# Patient Record
Sex: Male | Born: 2000 | Race: Black or African American | Hispanic: No | Marital: Single
Health system: Southern US, Community
[De-identification: ages and names within clinical notes are randomized; demographics above are authoritative.]

---
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
## 2019-01-09 ENCOUNTER — Other Ambulatory Visit: Payer: Self-pay

## 2019-01-09 DIAGNOSIS — Z20822 Contact with and (suspected) exposure to covid-19: Secondary | ICD-10-CM

## 2019-01-11 LAB — NOVEL CORONAVIRUS, NAA: SARS-CoV-2, NAA: NOT DETECTED

## 2019-09-10 ENCOUNTER — Other Ambulatory Visit: Payer: Self-pay

## 2019-09-10 ENCOUNTER — Encounter: Payer: Self-pay | Admitting: Family Medicine

## 2019-09-10 ENCOUNTER — Ambulatory Visit: Payer: Self-pay

## 2019-09-10 ENCOUNTER — Ambulatory Visit (INDEPENDENT_AMBULATORY_CARE_PROVIDER_SITE_OTHER): Payer: Commercial Managed Care - PPO | Admitting: Family Medicine

## 2019-09-10 VITALS — BP 106/74 | HR 71 | Ht 69.0 in | Wt 173.0 lb

## 2019-09-10 DIAGNOSIS — M7650 Patellar tendinitis, unspecified knee: Secondary | ICD-10-CM | POA: Insufficient documentation

## 2019-09-10 DIAGNOSIS — M7652 Patellar tendinitis, left knee: Secondary | ICD-10-CM | POA: Diagnosis not present

## 2019-09-10 DIAGNOSIS — G8929 Other chronic pain: Secondary | ICD-10-CM | POA: Diagnosis not present

## 2019-09-10 DIAGNOSIS — M25562 Pain in left knee: Secondary | ICD-10-CM

## 2019-09-10 MED ORDER — VITAMIN D (ERGOCALCIFEROL) 1.25 MG (50000 UNIT) PO CAPS: 50000.0000 [IU] | ORAL_CAPSULE | ORAL | 0 refills | Status: AC

## 2019-09-10 MED ORDER — NITROGLYCERIN 0.1 MG/HR TD PT24: MEDICATED_PATCH | TRANSDERMAL | 12 refills | Status: AC

## 2019-09-10 NOTE — Progress Notes (Signed)
[name] [address] [place] [address] Phone: [phone] Subjective:   Bruce Donath, am serving as a scribe for Dr. Antoine Primas. This visit occurred during the SARS-CoV-2 public health emergency.  Safety protocols were in place, including screening questions prior to the visit, additional usage of staff PPE, and extensive cleaning of exam room while observing appropriate contact time as indicated for disinfecting solutions.   I'm seeing this patient by the request  of:  Diamantina Monks, MD  CC: Left knee pain  [id number]  Timothy Huff is a 19 y.o. male coming in with complaint of left knee pain for over one year. No MOI. Played basketball through season despite pain. Jumping and sprinting bother him. Pain over patellar tendon. Pain has not improved.  Patient has had this for quite some time.  Wants to make sure that it is being treated appropriately.  Patient denies any instability of the knee.  Is able to play through it but unfortunately does have significant pain sometimes that stops him from activity minorly.  States that he can wait minutes and then I will feel little better again.       Past Medical History:  Diagnosis Date  . Asthma    No past surgical history on file. Social History   Socioeconomic History  . Marital status: Single    Spouse name: Not on file  . Number of children: Not on file  . Years of education: Not on file  . Highest education level: Not on file  Occupational History  . Not on file  Tobacco Use  . Smoking status: Not on file  Substance and Sexual Activity  . Alcohol use: No  . Drug use: No  . Sexual activity: Not on file  Other Topics Concern  . Not on file  Social History Narrative  . Not on file   Social Determinants of Health   Financial Resource Strain:   . Difficulty of Paying Living Expenses:   Food Insecurity:   . Worried About Programme researcher, broadcasting/film/video in the Last Year:   . Engineer, site in the Last Year:   Transportation Needs:   . Freight forwarder (Medical):   Marland Kitchen Lack of Transportation (Non-Medical):   Physical Activity:   . Days of Exercise per Week:   . Minutes of Exercise per Session:   Stress:   . Feeling of Stress :   Social Connections:   . Frequency of Communication with Friends and Family:   . Frequency of Social Gatherings with Friends and Family:   . Attends Religious Services:   . Active Member of Clubs or Organizations:   . Attends Banker Meetings:   Marland Kitchen Marital Status:    No Known Allergies No family history on file.   Current Outpatient Medications (Cardiovascular):  .  nitroGLYCERIN (NITRO-DUR) 0.1 mg/hr patch, 1/4 patch daily  Current Outpatient Medications (Respiratory):  .  albuterol (PROAIR HFA) 108 (90 BASE) MCG/ACT inhaler, Inhale 2 puffs into the lungs every 6 (six) hours as needed. Marland Kitchen  albuterol (PROVENTIL) (2.5 MG/3ML) 0.083% nebulizer solution, Take 2.5 mg by nebulization every 6 (six) hours as needed. Shortness of breath .  budesonide (PULMICORT) 0.5 MG/2ML nebulizer solution, Take 0.5 mg by nebulization 2 (two) times daily. .  fluticasone (FLONASE) 50 MCG/ACT nasal spray, Place 2 sprays into the nose daily. .  montelukast (SINGULAIR) 5 MG chewable tablet, Chew 5 mg by mouth at bedtime.  Current Outpatient Medications (Other):  Marland Kitchen  Vitamin D, Ergocalciferol, (DRISDOL) 1.25 MG (50000 UNIT) CAPS capsule, Take 1 capsule (50,000 Units total) by mouth every 7 (seven) days.   Reviewed prior external information including notes and imaging from  primary care provider As well as notes that were available from care everywhere and other healthcare systems.  Past medical history, social, surgical and family history all reviewed in electronic medical record.  No pertanent information unless stated regarding to the chief complaint.   Review of Systems:  No headache, visual changes, nausea, vomiting, diarrhea,  constipation, dizziness, abdominal pain, skin rash, fevers, chills, night sweats, weight loss, swollen lymph nodes, body aches, joint swelling, chest pain, shortness of breath, mood changes. POSITIVE muscle aches  Objective  Blood pressure 106/74, pulse 71, height 5\' 9"  (1.753 m), weight 173 lb (78.5 kg), SpO2 99 %.   General: No apparent distress alert and oriented x3 mood and affect normal, dressed appropriately.  HEENT: Pupils equal, extraocular movements intact  Respiratory: Patient's speak in full sentences and does not appear short of breath  Cardiovascular: No lower extremity edema, non tender, no erythema  Neuro: Cranial nerves II through XII are intact, neurovascularly intact in all extremities with 2+ DTRs and 2+ pulses.  Gait normal with good balance and coordination.  MSK:  Non tender with full range of motion and good stability and symmetric strength and tone of shoulders, elbows, wrist, hip, and ankles bilaterally.  Left knee exam shows the patient is tender to palpation over the patella tendon proximally. Patient's knee though otherwise has full range of motion.  Limited musculoskeletal ultrasound was performed and interpreted by Lyndal Pulley  Limited ultrasound of patient's knee shows that patient does have some very mild intrasubstance tearing of the origin at the inferior aspect of the patella noted.  Patient though also has what appears to be a cortical irregularity noted of the patella.  Could be cortical irregularity.  Growth plates are still open and symmetric to the contralateral side when compared Impression: Questionable stress reaction versus true avulsion of the inferior aspect of the patella   Impression and Recommendations:     The above documentation has been reviewed and is accurate and complete Lyndal Pulley, DO       Note: This dictation was prepared with Dragon dictation along with smaller phrase technology. Any transcriptional errors that result from  this process are unintentional.

## 2019-09-10 NOTE — Assessment & Plan Note (Signed)
Patient does have more of the patella tendinitis but also there is some opening of the deltoid.  Seems to be symmetric.  Not abnormal.  Likely getting some mild instability of the insertion of the patella though.  Discussed strap, home exercise, vitamin D, nitroglycerin patches and warned of potential side effects for the small partial tear noted.  Patient though is going to be playing in the next year.  There is a potential that patient could be a candidate for PRP but I believe patient will be well with conservative therapy.  Follow-up again in 4 to 8 weeks

## 2019-09-10 NOTE — Patient Instructions (Addendum)
Good to see you Once weekly vitamin D More biking and elliptical Patellar strap with playing Nitroglycerin Protocol   Apply 1/4 nitroglycerin patch to affected area daily.  Change position of patch within the affected area every 24 hours.  You may experience a headache during the first 1-2 weeks of using the patch, these should subside.  If you experience headaches after beginning nitroglycerin patch treatment, you may take your preferred over the counter pain reliever.  Another side effect of the nitroglycerin patch is skin irritation or rash related to patch adhesive.  Please notify our office if you develop more severe headaches or rash, and stop the patch.  Tendon healing with nitroglycerin patch may require 12 to 24 weeks depending on the extent of injury.  Men should not use if taking Viagra, Cialis, or Levitra.   Do not use if you have migraines or rosacea.  See me again in 5 weeks

## 2019-10-17 ENCOUNTER — Ambulatory Visit (INDEPENDENT_AMBULATORY_CARE_PROVIDER_SITE_OTHER): Payer: Commercial Managed Care - PPO | Admitting: Family Medicine

## 2019-10-17 ENCOUNTER — Encounter: Payer: Self-pay | Admitting: Family Medicine

## 2019-10-17 ENCOUNTER — Other Ambulatory Visit: Payer: Self-pay

## 2019-10-17 ENCOUNTER — Ambulatory Visit: Payer: Self-pay

## 2019-10-17 VITALS — BP 110/68 | HR 76 | Ht 69.0 in | Wt 172.0 lb

## 2019-10-17 DIAGNOSIS — M7652 Patellar tendinitis, left knee: Secondary | ICD-10-CM

## 2019-10-17 DIAGNOSIS — M25562 Pain in left knee: Secondary | ICD-10-CM | POA: Diagnosis not present

## 2019-10-17 NOTE — Patient Instructions (Signed)
Keep doing what we are doing Limit high impact or deep squats See me again in 3 weeks-ok to double book

## 2019-10-17 NOTE — Progress Notes (Signed)
[name] [address] [place] [address] Phone: [phone] Subjective:   Timothy Huff, am serving as a scribe for Dr. Antoine Primas. This visit occurred during the SARS-CoV-2 public health emergency.  Safety protocols were in place, including screening questions prior to the visit, additional usage of staff PPE, and extensive cleaning of exam room while observing appropriate contact time as indicated for disinfecting solutions.   I'm seeing this patient by the request  of:  Diamantina Monks, MD  CC: Knee pain follow-up  [id number]   09/10/2019 Patient does have more of the patella tendinitis but also there is some opening of the deltoid.  Seems to be symmetric.  Not abnormal.  Likely getting some mild instability of the insertion of the patella though.  Discussed strap, home exercise, vitamin D, nitroglycerin patches and warned of potential side effects for the small partial tear noted.  Patient though is going to be playing in the next year.  There is a potential that patient could be a candidate for PRP but I believe patient will be well with conservative therapy.  Follow-up again in 4 to 8 weeks  Update 10/17/2019 Timothy Huff is a 19 y.o. male coming in with complaint of left knee pain. Continues to have pain with little improvement. Using nitro patches. Pain is mostly felt with explosive movements. Returns to school in one month. Patient states that still having some pain.  Concerned because of basketball season will be starting in the next month or 2     Past Medical History:  Diagnosis Date  . Asthma    No past surgical history on file. Social History   Socioeconomic History  . Marital status: Single    Spouse name: Not on file  . Number of children: Not on file  . Years of education: Not on file  . Highest education level: Not on file  Occupational History  . Not on file  Tobacco Use  . Smoking status: Not on file  Substance  and Sexual Activity  . Alcohol use: No  . Drug use: No  . Sexual activity: Not on file  Other Topics Concern  . Not on file  Social History Narrative  . Not on file   Social Determinants of Health   Financial Resource Strain:   . Difficulty of Paying Living Expenses:   Food Insecurity:   . Worried About Programme researcher, broadcasting/film/video in the Last Year:   . Barista in the Last Year:   Transportation Needs:   . Freight forwarder (Medical):   Marland Kitchen Lack of Transportation (Non-Medical):   Physical Activity:   . Days of Exercise per Week:   . Minutes of Exercise per Session:   Stress:   . Feeling of Stress :   Social Connections:   . Frequency of Communication with Friends and Family:   . Frequency of Social Gatherings with Friends and Family:   . Attends Religious Services:   . Active Member of Clubs or Organizations:   . Attends Banker Meetings:   Marland Kitchen Marital Status:    No Known Allergies No family history on file.   Current Outpatient Medications (Cardiovascular):  .  nitroGLYCERIN (NITRO-DUR) 0.1 mg/hr patch, 1/4 patch daily  Current Outpatient Medications (Respiratory):  .  albuterol (PROAIR HFA) 108 (90 BASE) MCG/ACT inhaler, Inhale 2 puffs into the lungs every 6 (six) hours as needed. Marland Kitchen  albuterol (PROVENTIL) (2.5 MG/3ML) 0.083% nebulizer  solution, Take 2.5 mg by nebulization every 6 (six) hours as needed. Shortness of breath .  budesonide (PULMICORT) 0.5 MG/2ML nebulizer solution, Take 0.5 mg by nebulization 2 (two) times daily. .  fluticasone (FLONASE) 50 MCG/ACT nasal spray, Place 2 sprays into the nose daily. .  montelukast (SINGULAIR) 5 MG chewable tablet, Chew 5 mg by mouth at bedtime.    Current Outpatient Medications (Other):  Marland Kitchen  Vitamin D, Ergocalciferol, (DRISDOL) 1.25 MG (50000 UNIT) CAPS capsule, Take 1 capsule (50,000 Units total) by mouth every 7 (seven) days.   Reviewed prior external information including notes and imaging from  primary  care provider As well as notes that were available from care everywhere and other healthcare systems.  Past medical history, social, surgical and family history all reviewed in electronic medical record.  No pertanent information unless stated regarding to the chief complaint.   Review of Systems:  No headache, visual changes, nausea, vomiting, diarrhea, constipation, dizziness, abdominal pain, skin rash, fevers, chills, night sweats, weight loss, swollen lymph nodes, body aches, joint swelling, chest pain, shortness of breath, mood changes. POSITIVE muscle aches  Objective  Blood pressure 110/68, pulse 76, height 5\' 9"  (1.753 m), weight 172 lb (78 kg), SpO2 99 %.   General: No apparent distress alert and oriented x3 mood and affect normal, dressed appropriately.  HEENT: Pupils equal, extraocular movements intact  Respiratory: Patient's speak in full sentences and does not appear short of breath  Cardiovascular: No lower extremity edema, non tender, no erythema  Neuro: Cranial nerves II through XII are intact, neurovascularly intact in all extremities with 2+ DTRs and 2+ pulses.  Gait normal with good balance and coordination.  MSK: Left knee exam shows the patient actually is making improvement.  Patient does have less pain than last exam but still minorly tender over the patella tendon.  Patella though seems to be stable.  Limited musculoskeletal ultrasound was performed and interpreted by  Limited ultrasound shows the patient's calcific changes is improved.  Where patient had the partial tearing previously is improved as well.  No significant hypoechoic changes.  Mild increase in neovascularization noted.    Impression and Recommendations:     The above documentation has been reviewed and is accurate and complete Judi Saa, DO       Note: This dictation was prepared with Dragon dictation along with smaller phrase technology. Any transcriptional errors that  result from this process are unintentional.

## 2019-10-17 NOTE — Assessment & Plan Note (Signed)
Patient on ultrasound today does show some improvement from previous exam.  Patient symptoms are continuing seems to be improved.  Patient is going to continue the vitamin D, nitroglycerin, and exercises.  I do believe that the partial tear is improving.  Follow-up with me again in 3 weeks before patient leaves for college

## 2019-11-07 ENCOUNTER — Encounter: Payer: Self-pay | Admitting: Family Medicine

## 2019-11-07 ENCOUNTER — Ambulatory Visit (INDEPENDENT_AMBULATORY_CARE_PROVIDER_SITE_OTHER): Payer: Commercial Managed Care - PPO | Admitting: Family Medicine

## 2019-11-07 ENCOUNTER — Ambulatory Visit: Payer: Self-pay

## 2019-11-07 ENCOUNTER — Other Ambulatory Visit: Payer: Self-pay

## 2019-11-07 VITALS — BP 122/88 | HR 58 | Ht 69.0 in | Wt 172.0 lb

## 2019-11-07 DIAGNOSIS — M7652 Patellar tendinitis, left knee: Secondary | ICD-10-CM

## 2019-11-07 DIAGNOSIS — G8929 Other chronic pain: Secondary | ICD-10-CM | POA: Diagnosis not present

## 2019-11-07 DIAGNOSIS — M25562 Pain in left knee: Secondary | ICD-10-CM

## 2019-11-07 NOTE — Patient Instructions (Signed)
Continue nitro for a month Ok to increase activity See me when you need me

## 2019-11-07 NOTE — Assessment & Plan Note (Signed)
Patient does have more of a patellar tendinitis and seems to be doing significantly improved at this time.  Patient will continue the nitroglycerin for another month but then he can see me as needed.

## 2019-11-07 NOTE — Progress Notes (Signed)
[facility] [address] [place] [address] Phone: [phone] Subjective:   I Timothy Huff am serving as a Neurosurgeon for Dr. Antoine Huff.  This visit occurred during the SARS-CoV-2 public health emergency.  Safety protocols were in place, including screening questions prior to the visit, additional usage of staff PPE, and extensive cleaning of exam room while observing appropriate contact time as indicated for disinfecting solutions.   I'm seeing this patient by the request  of:  Timothy Monks, MD  CC: Left knee pain follow-up  [id number]   10/17/2019 Patient on ultrasound today does show some improvement from previous exam.  Patient symptoms are continuing seems to be improved.  Patient is going to continue the vitamin D, nitroglycerin, and exercises.  I do believe that the partial tear is improving.  Follow-up with me again in 3 weeks before patient leaves for college  Update 11/07/2019 Timothy Huff is a 19 y.o. male coming in with complaint of left knee pain. Patient states he is still having pain. Made some progress.  Patient would state 85 to 90% better overall.  Has been able to play basketball and only notices some discomfort in the joints off only that leg.      Past Medical History:  Diagnosis Date  . Asthma    No past surgical history on file. Social History   Socioeconomic History  . Marital status: Single    Spouse name: Not on file  . Number of children: Not on file  . Years of education: Not on file  . Highest education level: Not on file  Occupational History  . Not on file  Tobacco Use  . Smoking status: Not on file  Substance and Sexual Activity  . Alcohol use: No  . Drug use: No  . Sexual activity: Not on file  Other Topics Concern  . Not on file  Social History Narrative  . Not on file   Social Determinants of Health   Financial Resource Strain:   . Difficulty of Paying Living Expenses: Not on file  Food  Insecurity:   . Worried About Programme researcher, broadcasting/film/video in the Last Year: Not on file  . Ran Out of Food in the Last Year: Not on file  Transportation Needs:   . Lack of Transportation (Medical): Not on file  . Lack of Transportation (Non-Medical): Not on file  Physical Activity:   . Days of Exercise per Week: Not on file  . Minutes of Exercise per Session: Not on file  Stress:   . Feeling of Stress : Not on file  Social Connections:   . Frequency of Communication with Friends and Family: Not on file  . Frequency of Social Gatherings with Friends and Family: Not on file  . Attends Religious Services: Not on file  . Active Member of Clubs or Organizations: Not on file  . Attends Banker Meetings: Not on file  . Marital Status: Not on file   No Known Allergies No family history on file.   Current Outpatient Medications (Cardiovascular):  .  nitroGLYCERIN (NITRO-DUR) 0.1 mg/hr patch, 1/4 patch daily  Current Outpatient Medications (Respiratory):  .  albuterol (PROAIR HFA) 108 (90 BASE) MCG/ACT inhaler, Inhale 2 puffs into the lungs every 6 (six) hours as needed. Marland Kitchen  albuterol (PROVENTIL) (2.5 MG/3ML) 0.083% nebulizer solution, Take 2.5 mg by nebulization every 6 (six) hours as needed. Shortness of breath .  budesonide (PULMICORT) 0.5 MG/2ML nebulizer solution,  Take 0.5 mg by nebulization 2 (two) times daily. .  fluticasone (FLONASE) 50 MCG/ACT nasal spray, Place 2 sprays into the nose daily. .  montelukast (SINGULAIR) 5 MG chewable tablet, Chew 5 mg by mouth at bedtime.    Current Outpatient Medications (Other):  Marland Kitchen  Vitamin D, Ergocalciferol, (DRISDOL) 1.25 MG (50000 UNIT) CAPS capsule, Take 1 capsule (50,000 Units total) by mouth every 7 (seven) days.   Reviewed prior external information including notes and imaging from  primary care provider As well as notes that were available from care everywhere and other healthcare systems.  Past medical history, social, surgical  and family history all reviewed in electronic medical record.  No pertanent information unless stated regarding to the chief complaint.   Review of Systems:  No headache, visual changes, nausea, vomiting, diarrhea, constipation, dizziness, abdominal pain, skin rash, fevers, chills, night sweats, weight loss, swollen lymph nodes, body aches, joint swelling, chest pain, shortness of breath, mood changes. POSITIVE muscle aches  Objective  Blood pressure 122/88, pulse (!) 58, height 5\' 9"  (1.753 m), weight 172 lb (78 kg), SpO2 94 %.   General: No apparent distress alert and oriented x3 mood and affect normal, dressed appropriately.  HEENT: Pupils equal, extraocular movements intact  Respiratory: Patient's speak in full sentences and does not appear short of breath  Cardiovascular: No lower extremity edema, non tender, no erythema  Neuro: Cranial nerves II through XII are intact, neurovascularly intact in all extremities with 2+ DTRs and 2+ pulses.  Gait normal with good balance and coordination.  MSK:  Non tender with full range of motion and good stability and symmetric strength and tone of shoulders, elbows, wrist, hip and ankles bilaterally.  Left knee exam has full strength.  No swelling over the patella tendon.  Patient is able to jump up and down on one leg and full distal squat with no difficulty   Limited musculoskeletal ultrasound was performed and interpreted by  Limited ultrasound of patient's patellar tendon shows that patient has had good healing within the tendon sheath itself.  Patient has new tendon from previous exam as well and significant decrease in calcific changes. Impression: Interval healing of the patella tendon   Impression and Recommendations:     The above documentation has been reviewed and is accurate and complete Timothy Saa, DO       Note: This dictation was prepared with Dragon dictation along with smaller phrase technology. Any  transcriptional errors that result from this process are unintentional.
